# Patient Record
Sex: Female | Born: 1995 | Hispanic: Yes | Smoking: Never smoker
Health system: Southern US, Community
[De-identification: ages and names within clinical notes are randomized; demographics above are authoritative.]

---

## 2018-11-09 ENCOUNTER — Emergency Department
Admission: EM | Admit: 2018-11-09 | Discharge: 2018-11-09 | Disposition: A | Discharge status: Home / Self Care | Payer: No Typology Code available for payment source | Attending: Emergency Medicine | Admitting: Emergency Medicine

## 2018-11-09 ENCOUNTER — Encounter: Payer: Self-pay | Admitting: Emergency Medicine

## 2018-11-09 ENCOUNTER — Emergency Department: Payer: No Typology Code available for payment source

## 2018-11-09 ENCOUNTER — Other Ambulatory Visit: Payer: Self-pay

## 2018-11-09 DIAGNOSIS — M542 Cervicalgia: Secondary | ICD-10-CM | POA: Insufficient documentation

## 2018-11-09 DIAGNOSIS — M546 Pain in thoracic spine: Secondary | ICD-10-CM | POA: Diagnosis not present

## 2018-11-09 DIAGNOSIS — M79651 Pain in right thigh: Secondary | ICD-10-CM | POA: Diagnosis not present

## 2018-11-09 DIAGNOSIS — M7918 Myalgia, other site: Secondary | ICD-10-CM

## 2018-11-09 LAB — POCT PREGNANCY, URINE: Preg Test, Ur: NEGATIVE

## 2018-11-09 MED ORDER — BACLOFEN 10 MG PO TABS: 10.0000 mg | ORAL_TABLET | Freq: Three times a day (TID) | ORAL | 1 refills | Status: AC

## 2018-11-09 MED ORDER — MELOXICAM 15 MG PO TABS: 15.0000 mg | ORAL_TABLET | Freq: Every day | ORAL | 2 refills | Status: AC
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
## 2018-11-09 NOTE — Discharge Instructions (Addendum)
Follow-up with regular doctor if not better in 5 days.  Or you may follow-up with orthopedics if you feel you need physical therapy.  Take medications as prescribed.  Apply ice to all areas that hurt.  Return to emergency department worsening.
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
## 2019-06-01 ENCOUNTER — Ambulatory Visit (LOCAL_COMMUNITY_HEALTH_CENTER): Payer: Self-pay

## 2019-06-01 ENCOUNTER — Ambulatory Visit: Payer: Self-pay

## 2019-06-01 ENCOUNTER — Other Ambulatory Visit: Payer: Self-pay

## 2019-06-01 DIAGNOSIS — Z0184 Encounter for antibody response examination: Secondary | ICD-10-CM

## 2019-06-01 DIAGNOSIS — Z111 Encounter for screening for respiratory tuberculosis: Secondary | ICD-10-CM

## 2019-06-01 NOTE — Progress Notes (Signed)
Client desires Varicella titer today. ROI signed and client aware may retrieve copy of results at Progress appt 06/04/2019. Rich Number, RN

## 2019-06-04 ENCOUNTER — Ambulatory Visit (LOCAL_COMMUNITY_HEALTH_CENTER): Payer: Self-pay

## 2019-06-04 ENCOUNTER — Other Ambulatory Visit: Payer: Self-pay

## 2019-06-04 DIAGNOSIS — Z111 Encounter for screening for respiratory tuberculosis: Secondary | ICD-10-CM

## 2019-06-04 LAB — VARICELLA ZOSTER ANTIBODY, IGG: Varicella zoster IgG: <135 index — ABNORMAL LOW (ref >165)

## 2019-06-04 LAB — TB SKIN TEST
Induration: 0 mm
TB Skin Test: NEGATIVE

## 2019-06-21 ENCOUNTER — Ambulatory Visit (LOCAL_COMMUNITY_HEALTH_CENTER): Payer: Self-pay

## 2019-06-21 ENCOUNTER — Other Ambulatory Visit: Payer: Self-pay

## 2019-06-21 DIAGNOSIS — Z23 Encounter for immunization: Secondary | ICD-10-CM

## 2019-06-21 NOTE — Progress Notes (Signed)
Client declined Tdap vaccine today. Rich Number, RN
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
## 2020-10-10 ENCOUNTER — Ambulatory Visit (LOCAL_COMMUNITY_HEALTH_CENTER): Payer: Self-pay

## 2020-10-10 ENCOUNTER — Other Ambulatory Visit: Payer: Self-pay

## 2020-10-10 DIAGNOSIS — Z0184 Encounter for antibody response examination: Secondary | ICD-10-CM

## 2020-10-10 DIAGNOSIS — Z7185 Encounter for immunization safety counseling: Secondary | ICD-10-CM

## 2020-10-10 MED ORDER — PRENATAL VITAMINS 28-0.8 MG PO TABS: 28.0000 mg | ORAL_TABLET | Freq: Every day | ORAL | 0 refills | Status: DC

## 2020-10-10 NOTE — Progress Notes (Signed)
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 States she does not need Quantitative titer. [facility] review shows hx Hep B vaccine x 3. ROI signed. RN explained that she will be called when results received and discussed with pt the protocol based on SO for positive and negative results. Updated phone number obtained [phone].) Pt reports understanding and questions answered. Jerel Shepherd, RN

## 2020-10-11 LAB — HEPATITIS B SURFACE ANTIBODY,QUALITATIVE: Hep B Surface Ab, Qual: REACTIVE

## 2020-10-14 ENCOUNTER — Telehealth: Payer: Self-pay

## 2020-10-14 NOTE — Telephone Encounter (Signed)
Phone call to pt's phone number on file [phone]) in order to give Hep B titer results which are Reactive, consistent with immunity. No treatment indicated.   Phone call with No answer.  Unable to leave message d/t recording that  mailbox full. To attempt phone call at another time. Jerel Shepherd, RN Phone call re attempted 15 min later and spoke with pt. Results given and explained. Reports understanding. Pt plans to pick up copy of results at info booth between Mon-Fri,  8-5. Copy left at info booth for pt. Jerel Shepherd, RN
